# Patient Record
Sex: Female | Born: 1940 | Race: Black or African American | Hispanic: No
Health system: Southern US, Community
[De-identification: ages and names within clinical notes are randomized; demographics above are authoritative.]

---

## 2010-11-12 ENCOUNTER — Inpatient Hospital Stay (HOSPITAL_COMMUNITY): Payer: Medicare Other

## 2010-11-12 ENCOUNTER — Emergency Department (HOSPITAL_COMMUNITY): Payer: Medicare Other

## 2010-11-12 ENCOUNTER — Inpatient Hospital Stay (HOSPITAL_COMMUNITY)
Admission: EM | Admit: 2010-11-12 | Discharge: 2010-11-17 | DRG: 065 | Disposition: A | Discharge status: Skilled Nursing Facility | Payer: Medicare Other | Attending: Internal Medicine | Admitting: Internal Medicine

## 2010-11-12 DIAGNOSIS — I10 Essential (primary) hypertension: Secondary | ICD-10-CM | POA: Diagnosis present

## 2010-11-12 DIAGNOSIS — R471 Dysarthria and anarthria: Secondary | ICD-10-CM | POA: Diagnosis present

## 2010-11-12 DIAGNOSIS — IMO0002 Reserved for concepts with insufficient information to code with codable children: Secondary | ICD-10-CM | POA: Diagnosis present

## 2010-11-12 DIAGNOSIS — Z794 Long term (current) use of insulin: Secondary | ICD-10-CM

## 2010-11-12 DIAGNOSIS — Z7982 Long term (current) use of aspirin: Secondary | ICD-10-CM

## 2010-11-12 DIAGNOSIS — M1710 Unilateral primary osteoarthritis, unspecified knee: Secondary | ICD-10-CM | POA: Diagnosis present

## 2010-11-12 DIAGNOSIS — I6350 Cerebral infarction due to unspecified occlusion or stenosis of unspecified cerebral artery: Principal | ICD-10-CM | POA: Diagnosis present

## 2010-11-12 DIAGNOSIS — E669 Obesity, unspecified: Secondary | ICD-10-CM | POA: Diagnosis present

## 2010-11-12 DIAGNOSIS — F429 Obsessive-compulsive disorder, unspecified: Secondary | ICD-10-CM | POA: Diagnosis present

## 2010-11-12 DIAGNOSIS — R4789 Other speech disturbances: Secondary | ICD-10-CM | POA: Diagnosis present

## 2010-11-12 DIAGNOSIS — E1165 Type 2 diabetes mellitus with hyperglycemia: Secondary | ICD-10-CM | POA: Diagnosis present

## 2010-11-12 DIAGNOSIS — R4701 Aphasia: Secondary | ICD-10-CM | POA: Diagnosis present

## 2010-11-12 DIAGNOSIS — M79609 Pain in unspecified limb: Secondary | ICD-10-CM | POA: Diagnosis present

## 2010-11-12 DIAGNOSIS — F05 Delirium due to known physiological condition: Secondary | ICD-10-CM | POA: Diagnosis present

## 2010-11-12 DIAGNOSIS — G9389 Other specified disorders of brain: Secondary | ICD-10-CM | POA: Diagnosis present

## 2010-11-12 LAB — BASIC METABOLIC PANEL
BUN: 9 mg/dL (ref 6–23)
CO2: 30 mEq/L (ref 19–32)
Chloride: 98 mEq/L (ref 96–112)
Creatinine, Ser: 0.65 mg/dL (ref 0.4–1.2)
Glucose, Bld: 268 mg/dL — ABNORMAL HIGH (ref 70–99)
Potassium: 3.5 mEq/L (ref 3.5–5.1)

## 2010-11-12 LAB — TROPONIN I: Troponin I: <0.3 ng/mL (ref <0.30)

## 2010-11-12 LAB — CK TOTAL AND CKMB (NOT AT ARMC)
CK, MB: 4.7 ng/mL — ABNORMAL HIGH (ref 0.3–4.0)
Total CK: 142 U/L (ref 7–177)

## 2010-11-12 LAB — CBC
HCT: 37.1 % (ref 36.0–46.0)
MCH: 27.3 pg (ref 26.0–34.0)
MCV: 81.7 fL (ref 78.0–100.0)
RBC: 4.54 MIL/uL (ref 3.87–5.11)
WBC: 8.4 10*3/uL (ref 4.0–10.5)

## 2010-11-12 LAB — URINALYSIS, ROUTINE W REFLEX MICROSCOPIC
Bilirubin Urine: NEGATIVE
Glucose, UA: 500 mg/dL — AB
Hgb urine dipstick: NEGATIVE
Ketones, ur: NEGATIVE mg/dL
pH: 5.5 (ref 5.0–8.0)

## 2010-11-12 LAB — DIFFERENTIAL
Lymphocytes Relative: 33 % (ref 12–46)
Lymphs Abs: 2.7 10*3/uL (ref 0.7–4.0)
Monocytes Relative: 8 % (ref 3–12)
Neutrophils Relative %: 58 % (ref 43–77)

## 2010-11-12 LAB — APTT: aPTT: 29 seconds (ref 24–37)
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
## 2010-11-13 ENCOUNTER — Inpatient Hospital Stay (HOSPITAL_COMMUNITY): Payer: Medicare Other

## 2010-11-13 LAB — GLUCOSE, CAPILLARY

## 2010-11-14 ENCOUNTER — Inpatient Hospital Stay (HOSPITAL_COMMUNITY): Payer: Medicare Other

## 2010-11-14 LAB — GLUCOSE, CAPILLARY
Glucose-Capillary: 309 mg/dL — ABNORMAL HIGH (ref 70–99)
Glucose-Capillary: 304 mg/dL — ABNORMAL HIGH (ref 70–99)

## 2010-11-14 NOTE — Consult Note (Signed)
[name], [name]            ACCOUNT NO.:  [id number]  MEDICAL RECORD NO.:  [id number]           PATIENT TYPE:  I  LOCATION:  3732                         FACILITY:  [facility]  PHYSICIAN:  Levie Heritage, MD       DATE OF BIRTH:  Aug 30, 1940  DATE OF CONSULTATION:  11/13/2010 DATE OF DISCHARGE:                                CONSULTATION   REFERRING PHYSICIAN:  [facility].  REASON FOR CONSULTATION:  Stroke.  CHIEF COMPLAINT:  Slurring of speech.  HISTORY OF PRESENT ILLNESS:  This patient is a 70 year old woman with multiple comorbidities who presented yesterday with 2-day history of slurring of speech and the workup had revealed a small tiny ischemic infarct in the left uncus.  The patient states that currently she has pretty much improved her slurring to baseline normal and denies any other neurological deficits.  PAST MEDICAL HISTORY: 1. Stroke in the past. 2. Hypertension. 3. Diabetes mellitus. 4. Obsessive-compulsive disorder, apparently severe.  PAST SURGICAL HISTORY:  C-section, partial hysterectomy, and tonsillectomy.  ALLERGIES:  PENICILLIN.  SOCIAL HISTORY:  Nonsmoker and nondrinker.  Lives in an assisted facility.  FAMILY HISTORY:  Father had cardiac issues.  CURRENT LIST OF MEDICATIONS:  She is on: 1. Aspirin 325 mg daily.  The dose is increased from 81 to 325 mg     daily this admission. 2. Corrected dose of enoxaparin. 3. Insulin regimen. 4. Zoloft 50 mg daily. 5. Zocor 40 mg daily. 6. P.r.n. basis of pain medication. 7. Tradjenta 5 mg at bedtime.  REVIEW OF SYSTEMS:  Denies any recent fever.  Denies any weight loss. Denies any recent changes in her vision.  No issues with the weakness or the sensory paresthesias.  No fevers.  No rashes.  No burning urination. She does have chronic pains in her limbs.  Denies any nausea, vomiting, or diarrhea.  No pain on urination.  Rest of 10-organ review of system unremarkable.  REVIEW OF CLINICAL  DATA:  I have reviewed her MRI of the brain and has noted small tiny left uncal ischemic stroke with previous left temporooccipital infarct causing the encephalomalacia.  I have noted her labs as well.  Cardiac enzymes have been high at 4.7 with unremarkable CBC and BMP has elevated glucose.  Her HbA1c and lipid panel have been pending.  PHYSICAL EXAMINATION:  GENERAL:  Currently sitting at the bedside comfortably.  She does have mild dysarthria. VITAL SIGNS:  Blood pressure 161/75 mmHg, pulse 74, and temperature 97.4 degrees Fahrenheit. NEUROLOGIC:  Awake and oriented x3.  No acute distress.  Bilateral pupils reactive to light and accommodation.  No field cut noted at the limited bedside evaluation.  Moves eyes to all direction.  Symmetrical face for sensation and strength testing.  Midline tongue without atrophy or fasciculation.  Palate elevates symmetrically. Motor examination reveals 5/5 strength all over, although she is tender on the right arm.  As per the patient, this is being the case for over the years. Sensory examination; admits to feel light touch sensation all over normal and symmetrically. Gait was deferred. General body habitus reflects obesity at the trunk and the peri-neck areas  giving the impression of moon facies and lemon-on-stick appearance.  IMPRESSION:  A 70 year old woman with sudden onset of slurring of the speech that almost resolved back to her baseline, has an MRI showing a small left uncal ischemic infarct that is likely because of the small vessel disease.  Not sure about etiology of her cushingoid body habitus.  PLAN: 1. Agree by keeping the patient on full dose of aspirin from now     onwards. 2. Please get the Doppler carotid, cardiac echo, and the lipid panel     has been already ordered. 3. The stroke team will follow up the patient from the morning and I     will proceed as the case progresses.           ______________________________ Levie Heritage, MD     WS/MEDQ  D:  11/13/2010  T:  11/14/2010  Job:  [id number]  Electronically Signed by Levie Heritage MD on 11/14/2010 08:05:12 AM

## 2010-11-15 LAB — URINALYSIS, ROUTINE W REFLEX MICROSCOPIC
Bilirubin Urine: NEGATIVE
Glucose, UA: >1000 mg/dL — AB
Ketones, ur: NEGATIVE mg/dL
Leukocytes, UA: NEGATIVE
Protein, ur: NEGATIVE mg/dL

## 2010-11-15 LAB — COMPREHENSIVE METABOLIC PANEL
ALT: 13 U/L (ref 0–35)
AST: 19 U/L (ref 0–37)
Albumin: 3.4 g/dL — ABNORMAL LOW (ref 3.5–5.2)
CO2: 33 mEq/L — ABNORMAL HIGH (ref 19–32)
Calcium: 9.5 mg/dL (ref 8.4–10.5)
Chloride: 98 mEq/L (ref 96–112)
GFR calc Af Amer: >60 mL/min (ref >60)
GFR calc non Af Amer: >60 mL/min (ref >60)
Sodium: 140 mEq/L (ref 135–145)
Total Bilirubin: 0.2 mg/dL — ABNORMAL LOW (ref 0.3–1.2)

## 2010-11-15 LAB — CBC
Hemoglobin: 13.2 g/dL (ref 12.0–15.0)
RBC: 4.84 MIL/uL (ref 3.87–5.11)

## 2010-11-15 LAB — C-REACTIVE PROTEIN: CRP: 0.3 mg/dL — ABNORMAL LOW (ref <0.6)

## 2010-11-15 LAB — GLUCOSE, CAPILLARY
Glucose-Capillary: 222 mg/dL — ABNORMAL HIGH (ref 70–99)
Glucose-Capillary: 292 mg/dL — ABNORMAL HIGH (ref 70–99)

## 2010-11-15 LAB — URINE MICROSCOPIC-ADD ON

## 2010-11-15 LAB — CORTISOL: Cortisol, Plasma: 9.4 ug/dL

## 2010-11-15 LAB — LIPID PANEL
Total CHOL/HDL Ratio: 4.4 RATIO
Triglycerides: 211 mg/dL — ABNORMAL HIGH (ref <150)
VLDL: 42 mg/dL — ABNORMAL HIGH (ref 0–40)

## 2010-11-16 LAB — GLUCOSE, CAPILLARY
Glucose-Capillary: 257 mg/dL — ABNORMAL HIGH (ref 70–99)
Glucose-Capillary: 325 mg/dL — ABNORMAL HIGH (ref 70–99)
Glucose-Capillary: 205 mg/dL — ABNORMAL HIGH (ref 70–99)

## 2010-11-17 LAB — RHEUMATOID FACTOR: Rhuematoid fact SerPl-aCnc: <10 IU/mL (ref <=14)

## 2010-11-17 LAB — GLUCOSE, CAPILLARY: Glucose-Capillary: 277 mg/dL — ABNORMAL HIGH (ref 70–99)

## 2010-11-17 LAB — URIC ACID: Uric Acid, Serum: 5.2 mg/dL (ref 2.4–7.0)

## 2010-12-21 ENCOUNTER — Encounter (HOSPITAL_BASED_OUTPATIENT_CLINIC_OR_DEPARTMENT_OTHER): Payer: Medicare Other

## 2011-01-21 NOTE — Discharge Summary (Signed)
  NAMEORLI, DEGRAVE            ACCOUNT NO.:  [id number]  MEDICAL RECORD NO.:  [id number]           PATIENT TYPE:  I  LOCATION:  3732                         FACILITY:  [facility]  PHYSICIAN:  Baltazar Najjar, MD     DATE OF BIRTH:  15-May-1941  DATE OF ADMISSION:  11/12/2010 DATE OF DISCHARGE:                              DISCHARGE SUMMARY   ADDENDUM: A sleep study will be ordered to be done as an outpatient.  I will defer follow up of the report of the sleep study to PCP, Dr. Florentina Jenny and to the rounding physician at [facility].          ______________________________ Baltazar Najjar, MD     SA/MEDQ  D:  11/16/2010  T:  11/16/2010  Job:  [id number]  cc:   Dr. Florentina Jenny Rounding Physician  Electronically Signed by Hannah Beat MD on 01/21/2011 03:06:21 PM

## 2011-01-21 NOTE — Discharge Summary (Signed)
  [name], [name]            ACCOUNT NO.:  [id number]  MEDICAL RECORD NO.:  [id number]           PATIENT TYPE:  I  LOCATION:  3732                         FACILITY:  [facility]  PHYSICIAN:  Baltazar Najjar, MD     DATE OF BIRTH:  01-Mar-1941  DATE OF ADMISSION:  11/12/2010 DATE OF DISCHARGE:                              DISCHARGE SUMMARY   ADDENDUM:  Echocardiogram report showed an EF of 75% to 80%, systolic function was hyperdynamic and she also has some findings suggestive of moderate left ventricular concentric hypertrophy and grade 1 diastolic dysfunction.  No pericardial effusion.  The rest of the test quality was limited and poorly visualized cardiac valve.  The patient to follow with her PCP for any further recommendations.  Continue above medications as prescribed on the body of discharge summary.          ______________________________ Baltazar Najjar, MD     SA/MEDQ  D:  11/16/2010  T:  11/16/2010  Job:  [id number] cc:   [name], MD  Electronically Signed by Hannah Beat MD on 01/21/2011 03:06:46 PM

## 2011-01-21 NOTE — Discharge Summary (Signed)
[name], [name]            ACCOUNT NO.:  [id number]  MEDICAL RECORD NO.:  [id number]           PATIENT TYPE:  I  LOCATION:  3732                         FACILITY:  [facility]  PHYSICIAN:  Baltazar Najjar, MD     DATE OF BIRTH:  [birth year]  DATE OF ADMISSION:  11/12/2010 DATE OF DISCHARGE:                              DISCHARGE SUMMARY   FINAL DISCHARGE DIAGNOSES: 1. Left incus infarct. 2. Remote left temporal/occipital infarct. 3. Diabetes mellitus type 2, uncontrolled. 4. Right foot pain. 5. Hypertension. 6. History of obsessive-compulsive disorder.  CONSULTATION: 1. Neurology Service. 2. Dr. Otelia Sergeant from Orthopedic Service.  RADIOLOGY/IMAGING: 1. Chest x-ray showed no active disease.  Minimal pulmonary scarring. 2. Right knee x-ray showed osteoarthritis with near complete loss of     lateral joint space. 3. Left wrist x-ray moderate osteoarthritis. 4. Brain MRI showed tiny acute infarct on the left incus, remote     posterior left temporal/occipital lobe infarct with     encephalomalacia and lamina necrosis, global atrophy without     hydronephrosis, opacification of the right mastoid air cells and     middle ear cavity and opacification of the right sphenoid sinus,     possibility of acute sinusitis. 5. Right foot x-ray showed deformities of the cuboid, acute fracture     not excluded.  MRI may be helpful. 6. Right wrist x-ray showed no acute bony injury, erosion with sharp     tissue suggesting gout. 7. MRI of the right foot showed no finding of osteomyelitis or     abscess.  Degenerative arthropathy between the head of the first     metatarsal and the adjacent sesamoid with edema in the medial     sesamoid suggesting sesamoiditis or sigmoid fracture.  Hammertoe     toe deformities of the second through fourth toes.  Intermetatarsal     bursitis between the head of the first and second metatarsals.     Distal articular chronic small osteochondral lesions in the head  of     the first metatarsal.  Crowding of the second and third metatarsal     head, but without osseous edema to suggest over impingement. 8. Carotid Doppler sonogram showed no evidence of ICA stenosis     bilaterally and this is a preliminary report. 9. 2-D echocardiogram done, results pending at the time of dictation.  BRIEF ADMITTING HISTORY:  Please refer to H and P for more details.  In summary, Ms. [name] is a 70 year old woman with multiple comorbidities as above presented to the ER with chief complaint of slurring of speech.  Please refer to H and P for more details.  HOSPITAL COURSE: 1. The patient had CT scan and MRI done, which showed acute tiny left     incus infarct.  She was admitted with a diagnosis of stroke.  The     patient was seen by Neurology Service and initially she was given     aspirin that was transitioned to Plavix.  She is currently on     Plavix.  She had carotid Dopplers done showed no ICA stenosis, but  on the preliminary report 2-D echocardiogram done, however, I am     still waiting for the report prior to discharge.  The patient was     seen by PT/OT and Speech Therapy and she is planned to be     discharged to skilled nursing facility. 2. Right foot pain.  The patient had a fall at home.  She had x-rays     for her right foot, right knee, and left wrist as above.  Her right     foot MRI showed changes suggesting possible sesamoiditis with     bursitis, unclear if she had any flap fractures.  I discussed her     case with Dr. Otelia Sergeant who saw her in consultation from Orthopedic     Service who stated that it does not seem she had a fracture.  He     will not believe that she has an inflammation or infection given     the fact that her CRP was low, which was 0.3 which does not suggest     any inflammation or infection.  He recommended Ace wrap of the     right foot, orthopedic shoe, and he applied buddy tapes to her     second toe.  He kindly  would like to see her in the office for     further workup including uric acid level and ESR within 1 week of     discharge.  We will write her phone number in the chart for     arrangement for followup with Dr. Otelia Sergeant as an outpatient. 3. Osteoarthritis.  The patient to follow with Dr. Otelia Sergeant as above. 4. Type 2 diabetes mellitus uncontrolled.  Adjustment made for her     Lantus insulin to be increased to 55 units b.i.d., she was on 100     units daily previously.  She also on continuous Janumet.  Continue     monitor CBG t.i.d., before meals, and at bedtime and the patient     will need further adjustment of her hypoglycemic medicine and     Lantus insulin and I will defer that to her PCP rounding physician     at the nursing facility. 5. Hypertension, uncontrolled adjustment made to her antihypertensive     regimen as well. 6. History of obsessive-compulsive disorder.  The patient to follow     with her psychiatrist as an outpatient. 7. Questionable history of sleep apnea.  The patient was refusing CPAP     during this hospitalization.  She was referred for a sleep study in     the past, however, she did not complete her study as per her     daughter and the patient will make a referral for sleep study to be     done as an outpatient.  We will defer followup of that to her PCP     and adjust any further orders for CPAP to be determined based on     that study.  DISCHARGE MEDICATIONS: 1. Amlodipine 5 mg p.o. daily. 2. Plavix 75 mg p.o. daily. 3. Lisinopril 40 mg daily. 4. Lantus insulin 55 units subcutaneously twice daily. 5. Atorvastatin 20 mg p.o. daily. 6. Cetirizine 5 mg p.o. daily at bedtime. 7. Janumet 1 tablet p.o. twice daily. 8. NovoLog insulin 2-12 units subcutaneously four times a day. 9. Omeprazole 20 mg p.o. q.a.m. 10.Sertraline 50 mg p.o. q.a.m. 11.Tylenol 325 mg 2 tablets q.6 h. p.r.n. 12.Vitamin D3 1000 units p.o. q.a.m.  DISCHARGE INSTRUCTIONS: 1. The patient  to follow with her PCP within 1 week of discharge and     to follow with Dr. Otelia Sergeant as well within 1 week of discharge. 2. The patient to monitor CBG 3-4 times a day.  Further adjustment of     her hypoglycemic/insulin regimen defer to PCP. 3. The patient to be referred for a sleep study as an outpatient and     further management as per PCP. 4. The patient to report any worsening of symptoms or new symptoms to     her PCP or come back to the ED if needed.  CONDITION ON DISCHARGE:  Stable.          ______________________________ Baltazar Najjar, MD     SA/MEDQ  D:  11/16/2010  T:  11/16/2010  Job:  [id number]  cc:   Kerrin Champagne, M.D. Dr. Redmond School  Electronically Signed by Hannah Beat MD on 01/21/2011 03:06:36 PM

## 2012-06-17 ENCOUNTER — Inpatient Hospital Stay: Payer: Self-pay | Admitting: Internal Medicine

## 2012-06-17 LAB — URINALYSIS, COMPLETE
Bacteria: NONE SEEN
Glucose,UR: 50 mg/dL (ref 0–75)
Leukocyte Esterase: NEGATIVE
Nitrite: NEGATIVE
Specific Gravity: 1.006 (ref 1.003–1.030)
WBC UR: 4 /HPF (ref 0–5)

## 2012-06-17 LAB — CBC
HGB: 11.3 g/dL — ABNORMAL LOW (ref 12.0–16.0)
MCH: 26 pg (ref 26.0–34.0)
MCHC: 31.4 g/dL — ABNORMAL LOW (ref 32.0–36.0)

## 2012-06-17 LAB — COMPREHENSIVE METABOLIC PANEL
Albumin: 3.3 g/dL — ABNORMAL LOW (ref 3.4–5.0)
Alkaline Phosphatase: 111 U/L (ref 50–136)
BUN: 45 mg/dL — ABNORMAL HIGH (ref 7–18)
Calcium, Total: 8.8 mg/dL (ref 8.5–10.1)
Glucose: 153 mg/dL — ABNORMAL HIGH (ref 65–99)
Potassium: 4.2 mmol/L (ref 3.5–5.1)
SGOT(AST): 36 U/L (ref 15–37)
Sodium: 136 mmol/L (ref 136–145)
Total Protein: 7.7 g/dL (ref 6.4–8.2)

## 2012-06-17 LAB — CK TOTAL AND CKMB (NOT AT ARMC)
CK, Total: 162 U/L (ref 21–215)
CK-MB: 3.1 ng/mL (ref 0.5–3.6)

## 2012-06-18 LAB — CBC WITH DIFFERENTIAL/PLATELET
Basophil %: 0.3 %
Eosinophil #: 0 10*3/uL (ref 0.0–0.7)
Eosinophil %: 0.3 %
HGB: 11.3 g/dL — ABNORMAL LOW (ref 12.0–16.0)
Lymphocyte %: 19.7 %
Monocyte %: 10.4 %
Neutrophil %: 69.3 %
RBC: 4.2 10*6/uL (ref 3.80–5.20)
WBC: 7 10*3/uL (ref 3.6–11.0)

## 2012-06-18 LAB — BASIC METABOLIC PANEL
BUN: 28 mg/dL — ABNORMAL HIGH (ref 7–18)
Chloride: 101 mmol/L (ref 98–107)
Creatinine: 1.03 mg/dL (ref 0.60–1.30)
Osmolality: 286 (ref 275–301)
Potassium: 4.3 mmol/L (ref 3.5–5.1)
Sodium: 137 mmol/L (ref 136–145)

## 2012-06-20 LAB — VANCOMYCIN, TROUGH: Vancomycin, Trough: 10 ug/mL (ref 10–20)

## 2012-06-20 LAB — BASIC METABOLIC PANEL
Anion Gap: 3 — ABNORMAL LOW (ref 7–16)
BUN: 14 mg/dL (ref 7–18)
Chloride: 108 mmol/L — ABNORMAL HIGH (ref 98–107)
Co2: 30 mmol/L (ref 21–32)
Osmolality: 283 (ref 275–301)

## 2012-06-22 LAB — PLATELET COUNT: Platelet: 181 10*3/uL (ref 150–440)

## 2012-06-23 LAB — CULTURE, BLOOD (SINGLE)

## 2013-05-22 IMAGING — CT CT HEAD WITHOUT CONTRAST
2 series · 16 of 30 positions shown, 19 images · non-contrast
Comparison: none

REASON FOR EXAM: S/P fall with head injury
COMMENTS:

PROCEDURE:     CT  - CT HEAD WITHOUT CONTRAST  - June 17, 2012  [DATE]
RESULT:     Comparison:  None
TECHNIQUE: Multiple axial images from the foramen magnum to the vertex were
obtained without IV contrast.

[Series 2: soft tissue · axial · 0.39mm/px · z∈[-216,-156]mm · 4 of 33 slices shown]
[im 3/33  brain]
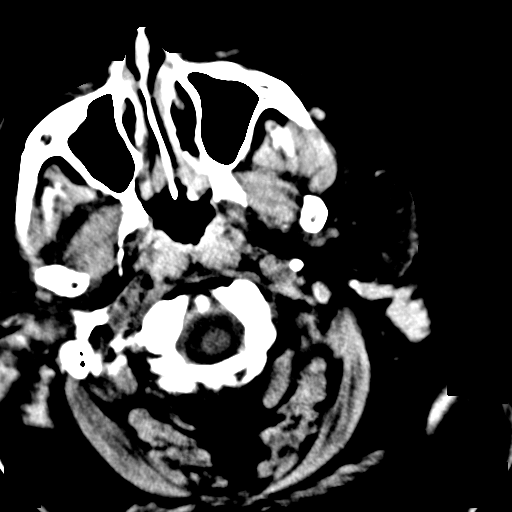
[im 7/33  brain]
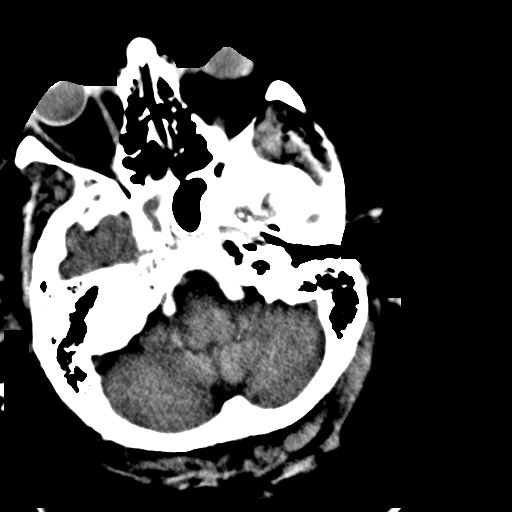
[im 11/33  brain]
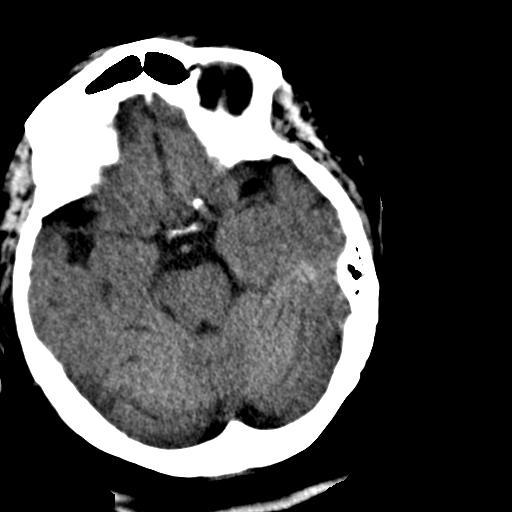
[im 15/33  brain]
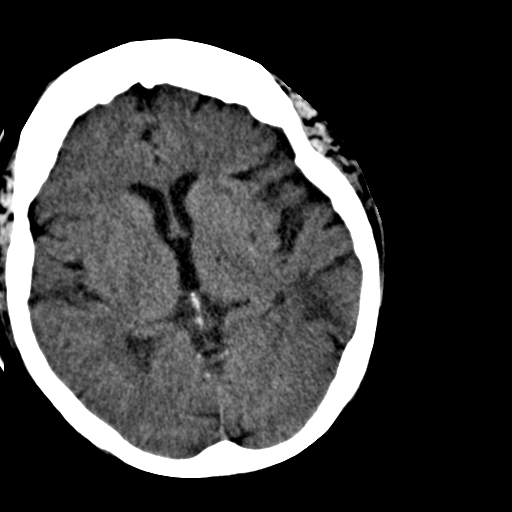

[Series 4: soft tissue 2 · axial · 0.39mm/px · z∈[-210,-90]mm · 12 of 30 slices shown, 15 images]
[im 3/30  brain]
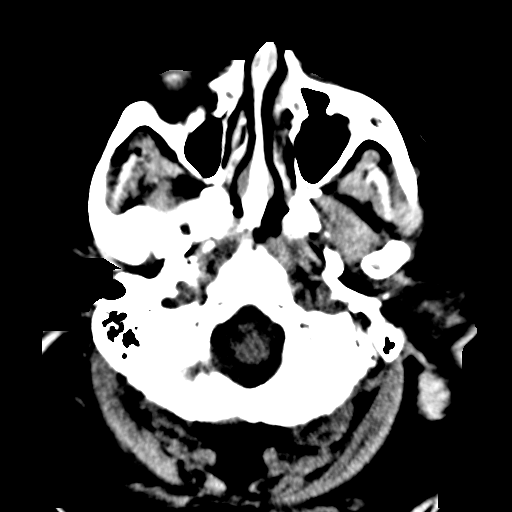
[im 3/30  bone]
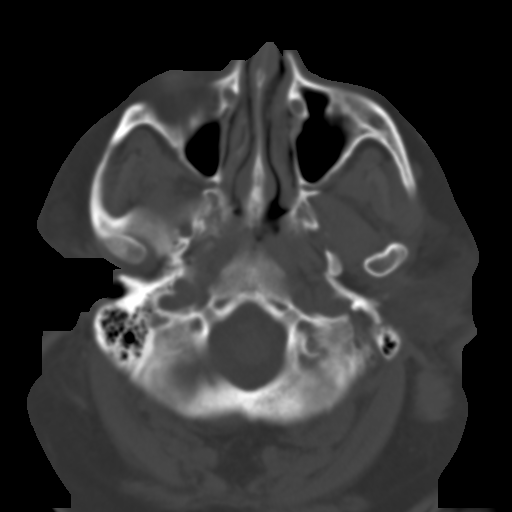
[im 5/30  brain]
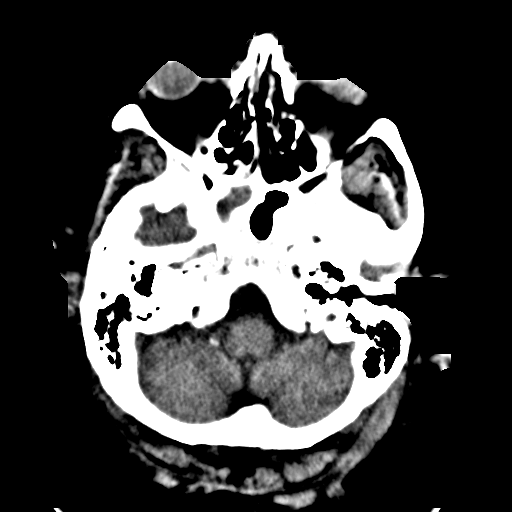
[im 7/30  brain]
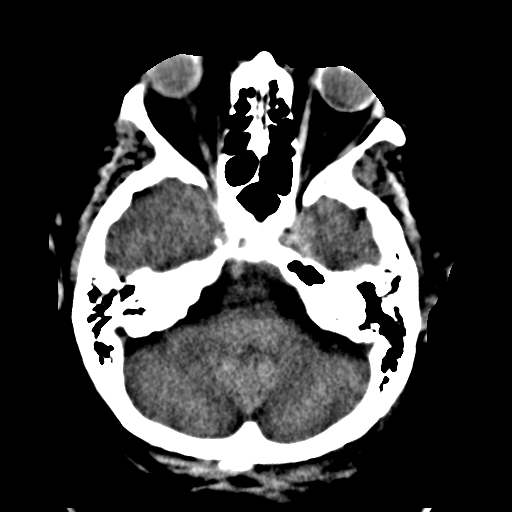
[im 9/30  brain]
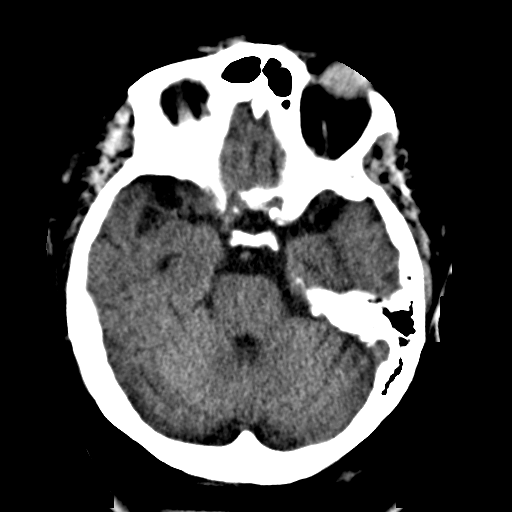
[im 12/30  brain]
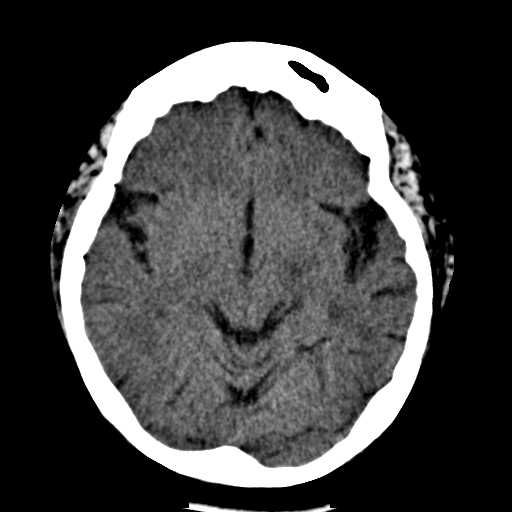
[im 12/30  bone]
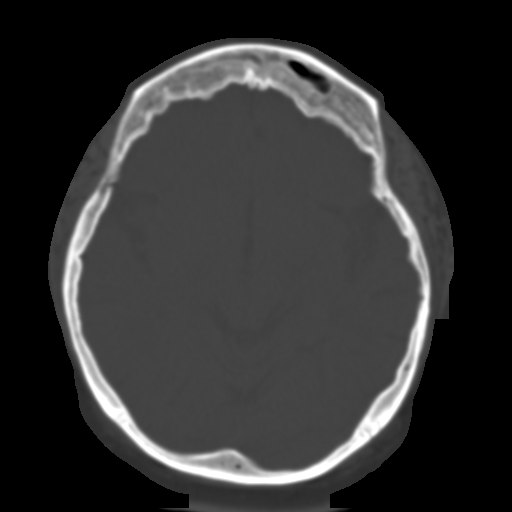
[im 14/30  brain]
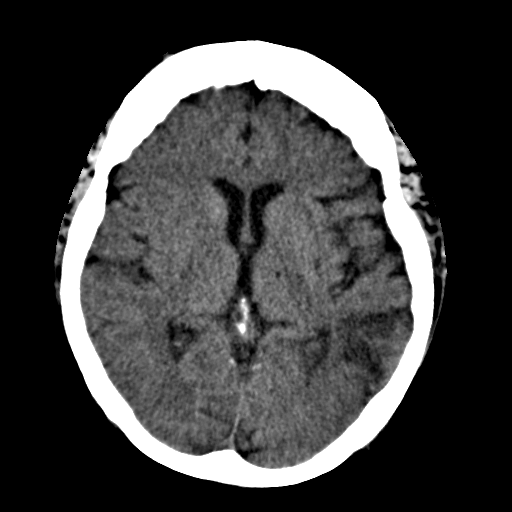
[im 16/30  brain]
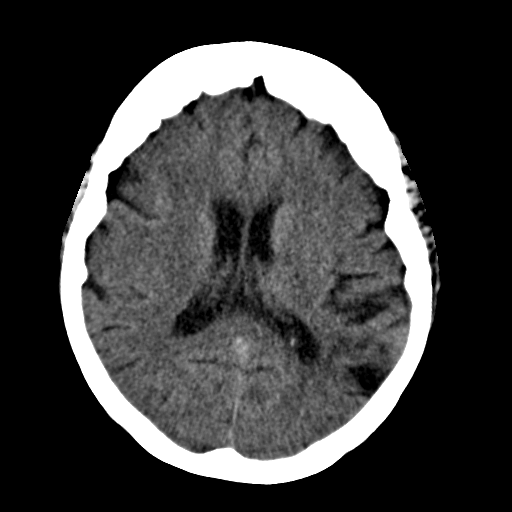
[im 18/30  brain]
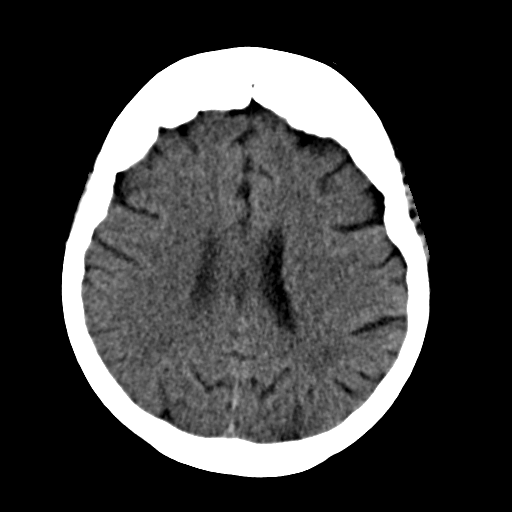
[im 21/30  brain]
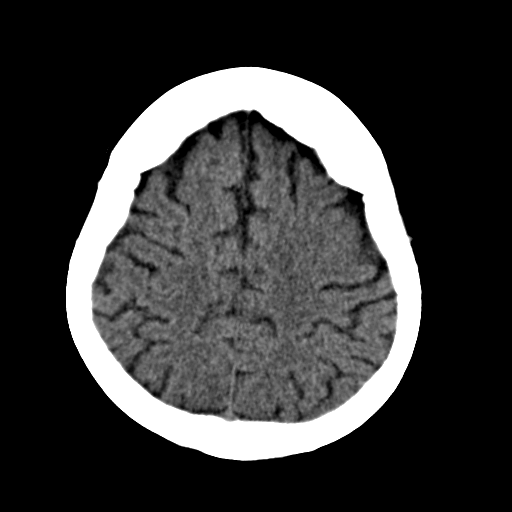
[im 21/30  bone]
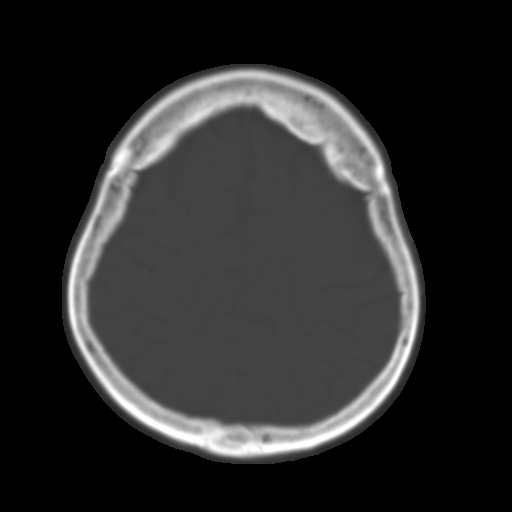
[im 23/30  brain]
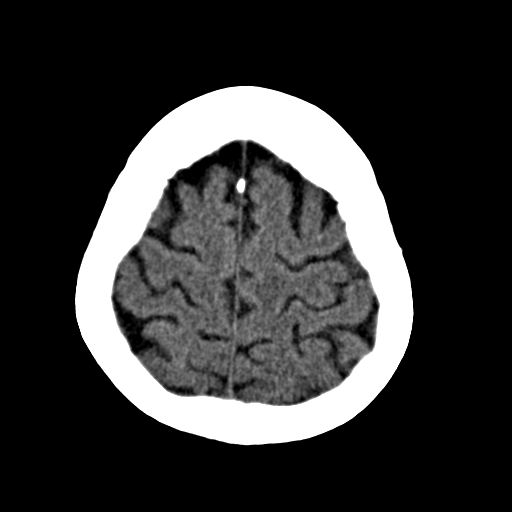
[im 25/30  brain]
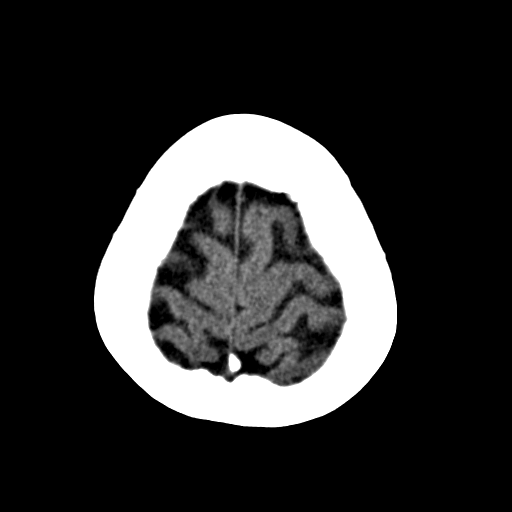
[im 27/30  brain]
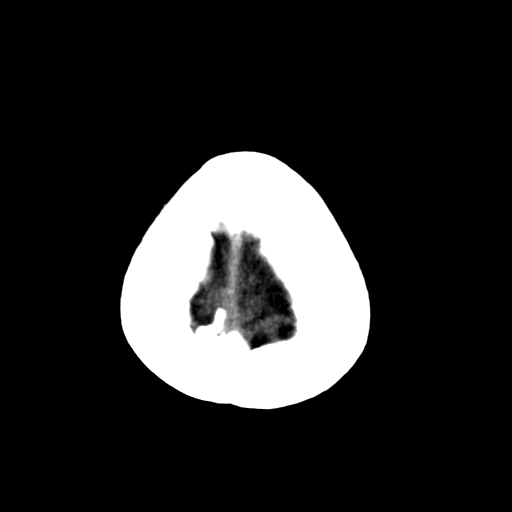

[16 of 30 positions shown; findings below may reference images not displayed]

FINDINGS: There is no evidence of mass effect, midline shift, or extra-axial fluid
collections.  There is no evidence of a space-occupying lesion or
intracranial hemorrhage. There is no evidence of a cortical-based area of
acute infarction. There is an old left parietal lobe infarct.

The ventricles and sulci are appropriate for the patient's age. The basal
cisterns are patent.

Visualized portions of the orbits are unremarkable. The visualized portions
of the paranasal sinuses and mastoid air cells are unremarkable.
Cerebrovascular atherosclerotic calcifications are noted.

The osseous structures are unremarkable.
IMPRESSION: No acute intracranial process.

[REDACTED]

## 2014-10-14 NOTE — Consult Note (Signed)
Impression: 74yo female w/ h/o DM, chronic LE dermatitis admitted with dizziness and falls who has chronic venous stasis with ulceration and inflammation.  Her falls appear to be related to her using a wheelchair most of the time and then getting up without any assistance.  She is being evaluated by PT currently. Her LE erythema and edema are due to chronic venous stasis.  This is unlikely to be an infection which has developed in bilateral LE for the past two months.  Her WBC is normal and she is afebrile.  The appearance is consistent with chronic venous stasis.  She has an ulceration which is also consistent with this diagnosis. Will stop her antibiotics. She will need to follow with outpt wound center to get ABIs prior to compression therapy.  Electronic Signatures: Charley Lafrance, Rosalyn GessMichael E (MD)  (Signed on 26-Dec-13 15:31)  Authored  Last Updated: 26-Dec-13 15:31 by Charistopher Rumble, Rosalyn GessMichael E (MD)

## 2014-10-14 NOTE — H&P (Signed)
PATIENT NAME:  Chloe [name], Chloe [name] MR#:  [id number] DATE OF BIRTH:  29-Jun-1940  DATE OF ADMISSION:  06/17/2012  PRIMARY CARE PHYSICIAN:  Alden ServerErnest B. Maryellen PileEason, MD  DERMATOLOGIST:  Bonna Gainsichard R. Orson AloeHenderson, MD at [facility].   NEUROLOGIST:  Paulita FujitaZachary E. Malvin JohnsPotter, MD  UROLOGISVerna Czech:  Scott C. Lonna CobbStoioff, MD  ENDOCRINOLOGY:  Macy MisA. Melissa Solum, MD  CHIEF COMPLAINT:  Dizziness, fall and worsening redness and pain in the lower extremities.   HISTORY OF PRESENTING ILLNESS:  A 74 year old female patient with history of diabetes, hypertension, bilateral lower extremity cellulitis/dermatitis, resident of a skilled nursing facility, presents to the Emergency Room after the patient was trying to come out of the bathroom, felt dizzy and fell down.  The patient was not using her walker which she normally uses.  The patient mostly moves around the nursing home in her wheelchair.  The patient mentions that she has been feeling dizzy for 2 days.  She has had worsening redness and pain in the lower extremities over the week.  The patient has been on multiple antibiotics, presently is following with dermatology and is on steroids and topical creams on her lower extremities.  The patient has had elevated blood sugars yesterday along with some chills but no fever.   She also has multiple medical complains which seem to be chronic, which include decreased hearing on the left ear and pain on the left side of her head for which she sees ENT and neurology.   PAST MEDICAL HISTORY:  Diabetes, hypertension, depression, OCD, anxiety, hypercholesterolemia, history of CVA with no residual weakness.   ALLERGIES:  PENICILLIN, REACTION UNKNOWN.   SOCIAL HISTORY:  Smoked but quit when she was 37.  No alcohol.  No illicit drugs.  Ambulates with a walker and in a wheelchair.  Her daughter is a Optometristpediatrician.   FAMILY HISTORY:  Reviewed, unknown.   REVIEW OF SYSTEMS: CONSTITUTIONAL:  Complains of fatigue, weakness.  No fever.   EYES:  No blurred vision, pain or redness.   ENT:  Has decreased hearing on the left side.  No sinus discharge.  RESPIRATORY:  Has _____ of cough.  No wheeze, hemoptysis or dyspnea.  CARDIOVASCULAR:  Has central chest pain since the fall because a chair hit on her chest.  No orthopnea or edema in the lower extremities.  No arrhythmia.  GASTROINTESTINAL:  No nausea, vomiting, diarrhea or abdominal pain.  GENITOURINARY:  No dysuria, hematuria.  Does have on and off urinary retention.  ENDOCRINE:  No polyuria, nocturia.  Has diabetes.  HEMATOLOGIC  AND LYMPHATIC:  No anemia, easy bruising, bleeding.  INTEGUMENTARY:  Has redness in the lower extremities with ulcer.  MUSCULOSKELETAL:  Has some pain in the left shoulder.  NEUROLOGIC:  Has stocking-glove numbness and some dysarthria secondary to old strokes.  PSYCHIATRIC:  Has anxiety, OCD.   HOME MEDICATIONS:  Include: 1.  DuoNeb 3 mL nebulizer every 4 hours as needed.  2.  Aripiprazole 20 mg oral in the morning and 5 mg at night.  3.  Clopidogrel 75 mg oral once a day.  4.  Divalproex 250 mg oral 2 times a day.  5.  Fish oil 1000 mg oral once a day.  6.  Levemir 80 units subcutaneous twice a day.  7.  Lisinopril 40 mg oral once a day.  8.  Loperamide 2 mg oral as needed for diarrhea.  9.  Multivitamin 1 tablet oral once a day.  10.  Neurontin 300 mg oral 3 times a day.  11.  NovoLog FlexPen 4 to 6 units subcutaneous 3 times a day prior to meals.   12.  Os-Cal 500 mg 1 tablet oral once a day.  13.  Pravastatin 20 mg oral once a day.  14.  Robitussin cough 10 mL oral every 6 hours as needed for cough.   15.  Sertraline 100 mg 2 tablets once a day.  16.  Torsemide 20 mg 2 tablets oral once a day.  17.  Vitamin D3 50,000 International Units once a month.   18.  Zyrtec 10 mg oral once a day.   PHYSICAL EXAMINATION: VITAL SIGNS:  Temperature 97.8, pulse of 76, respirations 20, blood pressure 137/71, saturating 94% on room air.  GENERAL:   Morbidly obese Caucasian female patient, sitting in a wheelchair.  He is alert, oriented x3, anxious, tearful at times.  HEENT:  Atraumatic, normocephalic.  Oral mucosa moist and pink.  External ears and nose normal.  No pallor.  No icterus.  Pupils bilaterally equal and reactive to light.   NECK:  Supple.  No thyromegaly.  No palpable lymph nodes.  Trachea midline.  No carotid bruit or JVD.  CARDIOVASCULAR:  S1, S2.  Regular rate and rhythm, without any murmurs.  Peripheral pulses 2+.  Edema in bilateral lower extremities.  RESPIRATORY:  Normal work of breathing.  Clear to auscultation on both sides.  GASTROINTESTINAL:  Soft abdomen, nontender.  Bowel sounds present.  No hepatosplenomegaly palpable.  SKIN:  Warm and dry, has erythema and tenderness in bilateral lower extremities on the shin, knee high and some on the feet dorsally.  Onychomycosis noticed.  Open ulcer on the right leg, anterior shin.  MUSCULOSKELETAL:  No joint swelling, redness, effusion of the large joints.  Normal muscle tone.  NEUROLOGICAL:  Motor strength 5-/5 in bilateral lower extremities and 5/5 in upper extremities.  Sensation decreased in extremities.  Symmetrical.    LABORATORY STUDIES:  Show glucose of 153, BUN 45, creatinine 1.36, sodium 136, potassium 4.2.  Albumin 3.3, CK 162, troponin less than 0.02.  WBC 8.3, hemoglobin 11.3.  Urinalysis shows 4 wbc, no bacteria.   Venous Dopplers of bilateral lower extremities show no DVT.   CT scan of the head without contrast shows no acute intracranial abnormalities.   ASSESSMENT AND PLAN: 1.  Bilateral lower extremity cellulitis over chronic dermatitis.  The patient has been on multiple antibiotics in the past.  We will start her on vancomycin, ceftriaxone.  Get blood cultures.  We will also consult the wound team.  Source of infection seems to be from her onychomycosis.  The patient will need a bariatric consult after discharge.  2.  Fall, likely secondary to not using her  walker.  The patient did have some dizziness.  We will check orthostatic vitals.  Hydrate her with IV fluids.  Did not lose consciousness.  CT scan of the head shows no intracranial bleed.  3.  Diabetes mellitus.  Continue home insulin doses and sliding scale insulin, diabetic diet.  4.  Hypertension.  Continue home medications.  5.  Acute renal failure versus chronic kidney disease.  The patient does have elevated creatinine, decreased GFR, could be checked chronic kidney disease from her diabetes, hypertension but cannot rule out acute renal failure.  We will recheck in the morning after IV fluids.  6.  Deep vein thrombosis prophylaxis with heparin.  7.  Code status:  Full code.  8.  Consult social worker as daughter requests that the patient be discharged  to a different nursing home.   Time spent today on this case was 60 minutes, with more than 50% time spent in coordination of care.     ____________________________ Molinda Bailiff. Adileny Delon, MD srs:eb D: 06/17/2012 18:29:27 ET T: 06/17/2012 19:58:39 ET JOB#: [id number]  cc: Wardell Heath R. Arieanna Pressey, MD, <Dictator> Serita Sheller. Maryellen Pile, MD Dr. _____  Orie Fisherman MD ELECTRONICALLY SIGNED 06/17/2012 21:25

## 2014-10-14 NOTE — Discharge Summary (Signed)
PATIENT NAME:  Chloe [name], Natoria MR#:  [id number] DATE OF BIRTH:  [birth year]  DATE OF ADMISSION:  06/17/2012 DATE OF DISCHARGE:  06/22/2012  PRIMARY CARE PHYSICIAN: Toy CookeyErnest Eason, MD  CONSULTANT: Orson AloeMichael Blocker, MD  DISCHARGE DIAGNOSES: 1. Bilateral lower extremity cellulitis over chronic dermatitis. 2. Acute renal failure/on chronic kidney disease.  3. Diabetes.  4. Difficulty with urination.   PROCEDURE: None.  CODE STATUS: FULL CODE.   CONDITION: Stable.   MEDICATIONS: Please refer to the medication reconciliation list in [facility] physician discharge summary.   DIET: Low sodium, low fat, low cholesterol, ADA diet.   ACTIVITY: As tolerated.   FOLLOW-UP CARE: Follow up with PCP within 1 to 2 weeks. Also the patient needs to keep the Foley for now and follow up with an urologist as outpatient and follow up with wound care center.   REASON FOR ADMISSION: Dizziness, fall and worsening redness and pain in the lower extremities.   HOSPITAL COURSE: The patient is a 74 year old Caucasian female with a history of hypertension, diabetes, bilateral lower extremity cellulitis and dermatitis who presented to the ED due to fall and dizziness. The patient was not using her walker, which she normally uses. She mostly moves around in the nursing home in her wheelchair.  She was on multiple antibiotics and is following up with a dermatologist. For detailed history and physical examination, please refer to the admission note dictated by Dr. Elpidio AnisSudini. On admission date, the patient's BUN was 45, creatinine 1.36, WBC 8.3, hemoglobin 11.3 and urinalysis negative. Venous duplex of bilateral lower extremities showed no DVT.  CAT scan of head showed no acute intracranial abnormality. The patient was admitted for bilateral lower extremity cellulitis over chronic dermatitis. After admission, the patient has been treated with vancomycin and Azactam IVPB. Erythema and warmness and tenderness of lower extremities is  improving. Dr. Leavy CellaBlocker evaluated the patient and suggests the patient does not need antibiotics anymore. The patient's bilateral cellulitis is possibly due to chronic dermatitis and venous stasis. For fall, the patient got physical therapy while in the hospital. For diabetes, the patient has been treated with sliding scale and Levemir. For acute renal failure and chronic kidney disease, the patient was treated with IV fluid support. On renal function,  BUN decreased to 14 and creatinine decreased to 0.96, which has improved. For the past 2 days, the patient has had difficulty with urination and incontinence The patient was treated with in and out catheter, but still could not urinate properly so the patient was placed with a Foley catheter. Since the patient is clinically stable, she will be discharged to [place] new skilled nursing facility in [place]. The patient needs to follow up with a wound care center there and also follow up with PCP and an urologist as outpatient. I discussed the patient's discharge plan with the patient and case manager.   TIME SPENT: About 38 minutes.  ____________________________ Shaune PollackQing Zak Gondek, MD qc:sb D: 06/22/2012 10:10:37 ET T: 06/22/2012 10:25:18 ET JOB#: [id number]  cc: Shaune PollackQing Colin Ellers, MD, <Dictator> Shaune PollackQING Kinzie Wickes MD ELECTRONICALLY SIGNED 06/22/2012 15:27

## 2014-10-14 NOTE — Consult Note (Signed)
PATIENT NAME:  Chloe [name], [name] MR#:  [id number] DATE OF BIRTH:  [birth year]  DATE OF CONSULTATION:  06/21/2012  REFERRING PHYSICIAN:   CONSULTING PHYSICIAN:  Rosalyn GessMichael E. Ivanell Deshotel, MD  REFERRING PHYSICIAN:  Dr. Imogene Burnhen  REASON FOR CONSULTATION:  Possible cellulitis.   HISTORY OF PRESENT ILLNESS:  The patient is a 74 year old female with a history of diabetes and chronic lower extremity dermatitis who was admitted on 12/22 after having a fall and complaining of dizziness. Apparently, the patient is mainly wheelchair bound and had gotten up without using any assistance and fell and was subsequently brought to the Emergency Room. She has had chronic lower extremity redness and swelling and ulceration on the right that has been going on for several months. She has been seen by dermatology and has been treated with topical creams. The H and P indicate she has been on multiple antibiotics, but none of them are listed. Per the patient, none of the treatments she has received have helped. She states the legs have been more painful and red than usual over the last week or so. She has had no fevers, chills, or sweats. She has remained afebrile in the hospital and her white count has been normal. She denies any focal trauma to the legs. Overall, she is an extremely poor historian, however, and it was difficult to get a cohesive sense for the history of her leg and illnesses. She does not believe she is receiving the creams and things that she was to be getting on it from the dermatologist.   ALLERGIES:  PENICILLIN. SHE DOES NOT KNOW WHAT HER REACTION IS. SHE DID RECEIVE CEFTRIAXONE X 1 DOSE IN THE EMERGENCY ROOM WITHOUT ANY APPARENT NEGATIVE EFFECTS.   PAST MEDICAL HISTORY:   1.  Diabetes.  2.  Hypertension.  3.  Depression.  4.  Excessive compulsive disorder.  5.  Hypercholesterolemia.  6.  Stroke without any residual sequela.  7.  Chronic lower extremity dermatitis bilaterally.   SOCIAL HISTORY:  The patient  is a prior smoker, but quit when she was much younger. She does not drink. No injecting drug use. She is mainly wheelchair bound, but occasionally ambulates with a walker.   FAMILY HISTORY:  Unable to obtain from the patient.   REVIEW OF SYSTEMS:  GENERAL: No fevers, chills, sweats. Positive fatigue.  HEENT: Positive dizziness. No sore throat or congestion.  RESPIRATORY: No cough or shortness of breath.  CARDIAC: No chest pains or palpitations.  GASTROINTESTINAL: No nausea, vomiting or change in her bowels. No abdominal pain.  GENITOURINARY: No complaints.  SKIN: She has chronic redness of both lower extremities that has been worse recently. She has a chronic ulceration on the left lower extremity that is unchanged. She has had no purulent drainage.  NEUROLOGIC: No focal weakness. She recently had some falling when she was ambulating without any assistance.  PSYCHIATRIC: She states she has OCD. All other systems are negative.   PHYSICAL EXAMINATION: VITAL SIGNS: T-max 99.4, T-current 97.5, pulse 63, blood pressure 113/64, 92% on room air.  GENERAL: Obese 74 year old white female in no acute distress.  HEENT: Normocephalic, atraumatic.  LUNGS: Clear to auscultation bilaterally. Good air movement. No focal consolidation.  CARDIAC: Regular rate and rhythm without murmur, rub, or gallop.  ABDOMEN: Soft, nontender, and nondistended. No hepatosplenomegaly. No hernias noted. Positive obesity.  EXTREMITIES: Both lower extremities were erythematous with chronic stasis changes and sharply demarcated edema bilaterally. There was erythema extending slightly beyond the edema as well. There was  also an ulceration with a dry scaly crust on the right lower extremity. The left extremity had no ulceration. The area was not particularly warm to touch. It was not particularly tender. There is no evidence for lymphangitic streaking.  SKIN: Other than the rash in the lower extremities, there were no other rashes  appreciated.  NEUROLOGIC: The patient was awake and interactive. She was a fairly poor historian, but was able to answer all questions.  PSYCHIATRIC: Mood and affect appeared pleasant. She did demonstrate some obsessive-compulsive symptoms such as not wanting to shake hands.   DIAGNOSTIC DATA:  BUN 28, creatinine 1.03, bicarbonate 30, anion gap of 6. LFTs on admission were unremarkable. White count on 12/23 was 7.0 with hemoglobin of 11.3, platelet count 158, ANC of 4.8 and white count on admission was 8.3. Blood cultures from admission showed no growth. Urinalysis from admission showed no significant abnormality. A CT scan of the head without contrast showed no acute intracranial process. Bilateral lower extremity Doppler showed no evidence for deep vein thrombosis.   IMPRESSION:  A 75 year old female with a history of diabetes and chronic lower extremity dermatitis admitted with dizziness and falls who has chronic venous stasis with ulceration and inflammation.   RECOMMENDATIONS:   1.  Her falls appear to be related to her using a wheelchair most of the time and then getting up without any assistance. She is being evaluated currently by PT.  2.  Her lower extremity erythema and edema are due to chronic venous stasis. This is unlikely to be an infection which has developed in bilateral lower extremity for the past 2 months. Her white count is normal and she is afebrile. The appearance is consistent with chronic venous stasis. She has an ulceration which is also consistent with this diagnosis.  3.  We will stop her antibiotics.  4.  She will need to follow with outpatient Wound Center to get ABIs prior to any compression therapy. There is a low level infectious disease consult.   Thank you very much for involving me in this patient's care.    ____________________________ Rosalyn Gess. Bonham Zingale, MD meb:si D: 06/21/2012 15:42:10 ET T: 06/21/2012 17:47:29 ET JOB#: [id number]  cc: Rosalyn Gess. Ottilia Pippenger, MD,  <Dictator> Francina Beery E Stefanee Mckell MD ELECTRONICALLY SIGNED 06/22/2012 10:40
